# Patient Record
Sex: Male | Born: 2005
Health system: Southern US, Community
[De-identification: ages and names within clinical notes are randomized; demographics above are authoritative.]

## PROBLEM LIST (undated history)

## (undated) DIAGNOSIS — R569 Unspecified convulsions: Secondary | ICD-10-CM

## (undated) HISTORY — PX: CIRCUMCISION: SUR203

## (undated) HISTORY — DX: Unspecified convulsions: R56.9

---
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
## 2023-01-03 ENCOUNTER — Ambulatory Visit (INDEPENDENT_AMBULATORY_CARE_PROVIDER_SITE_OTHER): Payer: BC Managed Care – PPO | Admitting: Neurology

## 2023-01-03 DIAGNOSIS — G40309 Generalized idiopathic epilepsy and epileptic syndromes, not intractable, without status epilepticus: Secondary | ICD-10-CM | POA: Diagnosis not present

## 2023-01-03 NOTE — Progress Notes (Signed)
EEG complete - results pending 

## 2023-01-05 NOTE — Progress Notes (Deleted)
Patient: Glenn Blackburn. MRN: [id number] Sex: male DOB: 2005-08-09  Provider: Keturah Shavers, MD Location of Care: [facility]  Note type: {CN NOTE OZHYQ:[id number]}  Referral Source: Elias Else, MD History from: {CN REFERRED XB:[id number]} Chief Complaint: Follow up ADHD, Seizures, Autism, Vitamin D Deficiency  History of Present Illness:  Masaji Valerio. is a 17 y.o. male ***.  Review of Systems: Review of system as per HPI, otherwise negative.  Past Medical History:  Diagnosis Date   Seizures (HCC)    Hospitalizations: {yes no:314532}, Head Injury: {yes no:314532}, Nervous System Infections: {yes no:314532}, Immunizations up to date: {yes no:314532}  Birth History ***  Surgical History Past Surgical History:  Procedure Laterality Date   CIRCUMCISION  2005   TONSILLECTOMY AND ADENOIDECTOMY  August 2012    Family History family history includes Diabetes in his maternal grandfather; Heart disease in his paternal grandmother; Hypertension in his maternal grandfather and paternal grandmother; Seizures in his maternal aunt, maternal grandmother, mother, and other family members. Family History is negative for ***.  Social History Social History   Socioeconomic History   Marital status: Single    Spouse name: Not on file   Number of children: Not on file   Years of education: Not on file   Highest education level: Not on file  Occupational History   Not on file  Tobacco Use   Smoking status: Never    Passive exposure: Yes   Smokeless tobacco: Never  Substance and Sexual Activity   Alcohol use: No   Drug use: No   Sexual activity: Never  Other Topics Concern   Not on file  Social History Narrative   Jebadiah is in the 11th grade and is homeschooled for the 23/24 school year   Lives with both parents. He has an adult brother that does not live in the home.    Doing fine in school   Social Determinants of Health   Financial  Resource Strain: Not on file  Food Insecurity: Not on file  Transportation Needs: Not on file  Physical Activity: Not on file  Stress: Not on file  Social Connections: Not on file     No Known Allergies  Physical Exam There were no vitals taken for this visit. ***  Assessment and Plan ***  No orders of the defined types were placed in this encounter.  No orders of the defined types were placed in this encounter.

## 2023-01-08 ENCOUNTER — Telehealth (INDEPENDENT_AMBULATORY_CARE_PROVIDER_SITE_OTHER): Payer: Self-pay

## 2023-01-08 ENCOUNTER — Ambulatory Visit (INDEPENDENT_AMBULATORY_CARE_PROVIDER_SITE_OTHER): Payer: Self-pay | Admitting: Neurology

## 2023-01-08 NOTE — Procedures (Signed)
Patient:  Glenn Blackburn.   Sex: male  DOB:  Oct 28, 2005  Date of study: 01/03/2023                Clinical history: This is an 17 year old male with history of autism, ADHD, sleep difficulty and generalized seizure disorder with no clinical seizure activity since 2021.  His previous EEG was abnormal with bursts of generalized discharges.  This is a follow-up EEG for evaluation of epileptiform discharges.  Medication: Depakote              Procedure: The tracing was carried out on a 32 channel digital Cadwell recorder reformatted into 16 channel montages with 1 devoted to EKG.  The 10 /20 international system electrode placement was used. Recording was done during awake state. Recording time 30.5 minutes.   Description of findings: Background rhythm consists of amplitude of 35 microvolt and frequency of 8-9 hertz posterior dominant rhythm. There was normal anterior posterior gradient noted. Background was well organized, continuous and symmetric with no focal slowing. There was muscle artifact noted. Hyperventilation resulted in slowing of the background activity. Photic stimulation using stepwise increase in photic frequency resulted in bilateral symmetric driving response. Throughout the recording there were bursts of high amplitude generalized polymorphic discharges in the form of spike and wave activity noted during the first part of the recording. There were no transient rhythmic activities or electrographic seizures noted. One lead EKG rhythm strip revealed sinus rhythm at a rate of 60 bpm.  Impression: This EEG is abnormal due to bursts of generalized discharges as described. The findings are consistent with generalized seizure disorder, associated with lower seizure threshold and require careful clinical correlation.   Keturah Shavers, MD

## 2023-01-08 NOTE — Telephone Encounter (Signed)
LM for Mother to call office and set up MyChart access as Legal Guardian of patient (Diminished capacity patient/complex needs)  Note: Upon return call, please assist.  B. Roten CMA

## 2023-01-08 NOTE — Progress Notes (Deleted)
   This is a Pediatric Specialist E-Visit follow up consult provided via *** (select one) Telephone, MyChart, WebEx Davien Freddie Apley. and their parent/guardian ***   consented to an E-Visit consult today.  Location of patient: Glenn Blackburn is at Home(location) Location of provider: Keturah Shavers, MD is at Office (location) Patient was referred by No ref. provider found   The following participants were involved in this E-Visit: Hali Marry, CMA              Keturah Shavers, MD  Chief Complain/ Reason for E-Visit today: Follow up for Seizures, ADHD, Autism & Vitamin D Total time on call: *** Follow up: ***   Patient: Glenn Blackburn. MRN: [id number] Sex: male DOB: 2005-11-16  Provider: Keturah Shavers, MD Location of Care: [facility]  Note type: {CN NOTE TYPES:210120001} History from: {CN REFERRED WU:[id number]} Chief Complaint: Follow up Seizures, ADHD, Autism & Vitamin D   History of Present Illness:  Glenn Blackburn. is a 17 y.o. male ***.  Review of Systems: 12 system review as per HPI, otherwise negative.  Past Medical History:  Diagnosis Date   Seizures (HCC)    Hospitalizations: {yes no:314532}, Head Injury: {yes no:314532}, Nervous System Infections: {yes no:314532}, Immunizations up to date: {yes no:314532}  Birth History ***  Surgical History Past Surgical History:  Procedure Laterality Date   CIRCUMCISION  2005   TONSILLECTOMY AND ADENOIDECTOMY  August 2012    Family History family history includes Diabetes in his maternal grandfather; Heart disease in his paternal grandmother; Hypertension in his maternal grandfather and paternal grandmother; Seizures in his maternal aunt, maternal grandmother, mother, and other family members. Family History is negative for ***.  Social History Social History   Socioeconomic History   Marital status: Single    Spouse name: Not on file   Number of children: Not on file   Years of  education: Not on file   Highest education level: Not on file  Occupational History   Not on file  Tobacco Use   Smoking status: Never    Passive exposure: Yes   Smokeless tobacco: Never  Substance and Sexual Activity   Alcohol use: No   Drug use: No   Sexual activity: Never  Other Topics Concern   Not on file  Social History Narrative   Axle is in the 11th grade and is homeschooled for the 23/24 school year   Lives with both parents. He has an adult brother that does not live in the home.    Doing fine in school   Social Determinants of Health   Financial Resource Strain: Not on file  Food Insecurity: Not on file  Transportation Needs: Not on file  Physical Activity: Not on file  Stress: Not on file  Social Connections: Not on file     The medication list was reviewed and reconciled. All changes or newly prescribed medications were explained.  A complete medication list was provided to the patient/caregiver.  No Known Allergies  Physical Exam There were no vitals taken for this visit. ***  Assessment and Plan ***  No orders of the defined types were placed in this encounter.  No orders of the defined types were placed in this encounter.

## 2023-01-08 NOTE — Telephone Encounter (Signed)
Mom returned call.  MyChart (Diminished capacity) access provided.  B. Roten CMA

## 2023-01-09 ENCOUNTER — Other Ambulatory Visit (INDEPENDENT_AMBULATORY_CARE_PROVIDER_SITE_OTHER): Payer: Self-pay

## 2023-01-09 ENCOUNTER — Telehealth (INDEPENDENT_AMBULATORY_CARE_PROVIDER_SITE_OTHER): Payer: Self-pay | Admitting: Neurology

## 2023-01-09 MED ORDER — DIVALPROEX SODIUM ER 500 MG PO TB24: 1000.0000 mg | ORAL_TABLET | Freq: Every day | ORAL | 0 refills | Status: DC

## 2023-01-09 NOTE — Telephone Encounter (Signed)
Changed to In person visit: July 2024.   B. Roten CMA

## 2023-01-09 NOTE — Telephone Encounter (Signed)
Called mother and changed Video Visit (from today 06-18) to in person visit in July.   Note: 06-17*-2024 (Late/No Show/Reschedule).  Rx refill request sent to provider through/until next appointment.  B. Roten CMA

## 2023-02-20 ENCOUNTER — Encounter (INDEPENDENT_AMBULATORY_CARE_PROVIDER_SITE_OTHER): Payer: Self-pay | Admitting: Neurology

## 2023-02-20 ENCOUNTER — Ambulatory Visit (INDEPENDENT_AMBULATORY_CARE_PROVIDER_SITE_OTHER): Payer: BC Managed Care – PPO | Admitting: Neurology

## 2023-02-20 VITALS — BP 114/70 | HR 78 | Ht 72.95 in | Wt 150.2 lb

## 2023-02-20 DIAGNOSIS — E559 Vitamin D deficiency, unspecified: Secondary | ICD-10-CM

## 2023-02-20 DIAGNOSIS — F902 Attention-deficit hyperactivity disorder, combined type: Secondary | ICD-10-CM

## 2023-02-20 DIAGNOSIS — F840 Autistic disorder: Secondary | ICD-10-CM | POA: Diagnosis not present

## 2023-02-20 DIAGNOSIS — G40309 Generalized idiopathic epilepsy and epileptic syndromes, not intractable, without status epilepticus: Secondary | ICD-10-CM | POA: Diagnosis not present

## 2023-02-20 LAB — CBC WITH DIFFERENTIAL/PLATELET
Absolute Monocytes: 439 cells/uL (ref 200–900)
Basophils Absolute: 31 cells/uL (ref 0–200)
Basophils Relative: 0.3 %
Eosinophils Absolute: 143 cells/uL (ref 15–500)
Eosinophils Relative: 1.4 %
HCT: 42 % (ref 36.0–49.0)
Hemoglobin: 12.8 g/dL (ref 12.0–16.9)
Lymphs Abs: 3988 cells/uL (ref 1200–5200)
MCH: 24 pg — ABNORMAL LOW (ref 25.0–35.0)
MCHC: 30.5 g/dL — ABNORMAL LOW (ref 31.0–36.0)
MCV: 78.7 fL (ref 78.0–98.0)
MPV: 11.3 fL (ref 7.5–12.5)
Monocytes Relative: 4.3 %
Neutro Abs: 5600 cells/uL (ref 1800–8000)
Neutrophils Relative %: 54.9 %
Platelets: 312 10*3/uL (ref 140–400)
RBC: 5.34 10*6/uL (ref 4.10–5.70)
RDW: 12.3 % (ref 11.0–15.0)
Total Lymphocyte: 39.1 %
WBC: 10.2 10*3/uL (ref 4.5–13.0)

## 2023-02-20 MED ORDER — DIVALPROEX SODIUM ER 500 MG PO TB24: 1000.0000 mg | ORAL_TABLET | Freq: Every day | ORAL | 2 refills | Status: DC

## 2023-02-20 MED ORDER — NAYZILAM 5 MG/0.1ML NA SOLN: NASAL | 1 refills | Status: DC

## 2023-02-20 NOTE — Patient Instructions (Signed)
Continue the same dose of Depakote at 1000 mg every night We will schedule for blood work to be done today He needs to have adequate sleep and limited screen time I will send a prescription for Nayzilam as a rescue medication in case of prolonged seizure activity We will schedule for sleep deprived EEG at the same time in the next visit Return in 7 months for follow-up visit

## 2023-02-20 NOTE — Progress Notes (Signed)
Patient: Glenn Blackburn. MRN: [id number] Sex: male DOB: 2006/03/01  Provider: Keturah Shavers, MD Location of Care: [facility] Child Neurology  Note type: Routine return visit  Referral Source: Elias Else, MD History from: patient, [facility] chart, and father Chief Complaint: seizures  History of Present Illness: Glenn Blackburn. is a 17 y.o. male is here for follow-up management of seizure disorder. He has diagnosis of autism spectrum disorder, ADHD, anxiety, sleep difficulty and generalized seizure disorder, has been on Depakote with fairly good seizure control although he did have a breakthrough seizure a couple months ago when he was out of medication. His EEG is including the last 1 last month in June shows bursts of generalized discharges and occasionally come in clusters.  His last blood work in May 2021 showed Depakote level 39.5 and he has not done the blood work that was recommended on his last visit in July 2023. Currently he is taking Depakote long-acting form 1000 mg every night with no side effects. He usually sleeps well without any difficulty and with no awakening.  He has no significant behavioral or mood changes.  He does have history of vitamin D deficiency-last vitamin D level was 8. As per father he usually sleeps very late and he plays videogames a lot throughout the day and night.    Review of Systems: Review of system as per HPI, otherwise negative.  Past Medical History:  Diagnosis Date   Seizures [facility])     Surgical History Past Surgical History:  Procedure Laterality Date   CIRCUMCISION  2005   TONSILLECTOMY AND ADENOIDECTOMY  August 2012    Family History family history includes Diabetes in his maternal grandfather; Heart disease in his paternal grandmother; Hypertension in his maternal grandfather and paternal grandmother; Seizures in his maternal aunt, maternal grandmother, mother, and other family members.   Social History Social  History   Socioeconomic History   Marital status: Single    Spouse name: Not on file   Number of children: Not on file   Years of education: Not on file   Highest education level: Not on file  Occupational History   Not on file  Tobacco Use   Smoking status: Never    Passive exposure: Yes   Smokeless tobacco: Never  Substance and Sexual Activity   Alcohol use: No   Drug use: No   Sexual activity: Never  Other Topics Concern   Not on file  Social History Narrative   Glenn Blackburn is in the 11th grade and is homeschooled for the 23/24 school year   Lives with both parents. He has an adult brother that does not live in the home.    Doing fine in school   Social Determinants of Health   Financial Resource Strain: Not on file  Food Insecurity: Not on file  Transportation Needs: Not on file  Physical Activity: Not on file  Stress: Not on file  Social Connections: Not on file     No Known Allergies  Physical Exam BP 114/70   Pulse 78   Ht 6' 0.95" (1.853 m)   Wt 150 lb 4 oz (68.2 kg)   BMI 19.85 kg/m  Gen: Awake, alert, not in distress Skin: No rash, No neurocutaneous stigmata. HEENT: Normocephalic, no dysmorphic features, no conjunctival injection, nares patent, mucous membranes moist, oropharynx clear. Neck: Supple, no meningismus. No focal tenderness. Resp: Clear to auscultation bilaterally CV: Regular rate, normal S1/S2, no murmurs, no rubs Abd: BS present, abdomen soft, non-tender, non-distended.  No hepatosplenomegaly or mass Ext: Warm and well-perfused. No deformities, no muscle wasting, ROM full.  Neurological Examination: MS: Awake, alert, interactive. Normal eye contact, answered the questions appropriately, speech was fluent,  Normal comprehension.  Attention and concentration were normal. Cranial Nerves: Pupils were equal and reactive to light ( 5-51mm);  normal fundoscopic exam with sharp discs, visual field full with confrontation test; EOM normal, no nystagmus;  no ptsosis, no double vision, intact facial sensation, face symmetric with full strength of facial muscles, hearing intact to finger rub bilaterally, palate elevation is symmetric, tongue protrusion is symmetric with full movement to both sides.  Sternocleidomastoid and trapezius are with normal strength. Tone-Normal Strength-Normal strength in all muscle groups DTRs-  Biceps Triceps Brachioradialis Patellar Ankle  R 2+ 2+ 2+ 2+ 2+  L 2+ 2+ 2+ 2+ 2+   Plantar responses flexor bilaterally, no clonus noted Sensation: Intact to light touch, temperature, vibration, Romberg negative. Coordination: No dysmetria on FTN test. No difficulty with balance. Gait: Normal walk and run. Tandem gait was normal. Was able to perform toe walking and heel walking without difficulty.   Assessment and Plan 1. Generalized seizure disorder (HCC)   2. Attention deficit hyperactivity disorder (ADHD), combined type   3. Autism spectrum   4. Vitamin D deficiency     This is an 21 and half-year-old male with history of autism, ADHD, anxiety and seizure disorder, currently on moderate dose of Depakote with fairly good seizure control although with 1 breakthrough seizure 2 months ago when he was out of medication.  He has no focal findings on his neurological examination at this time. Recommend to continue the same dose of Depakote at 1 g daily We will schedule for blood work to check the trough level of medication as well as CBC and CMP and vitamin D level to be done today I will send a prescription for Nayzilam as a rescue medication in case of prolonged seizure activity We discussed with patient and his father in details regarding the importance of adequate sleep and limited screen time to prevent seizure activity and also not to skip any dose of medication which may cause more seizure We will schedule for a follow-up sleep deprived EEG at the same time the next visit I would like to see him in 6 months or 7 months  for follow-up visit and based on clinical response and EEG and blood work may adjust the dose of medication.  He and his father understood and agreed with the plan.  I spent 40 minutes with patient and his father, more than 50% time spent for counseling and coordination of care.   Meds ordered this encounter  Medications   divalproex (DEPAKOTE ER) 500 MG 24 hr tablet    Sig: Take 2 tablets (1,000 mg total) by mouth at bedtime.    Dispense:  180 tablet    Refill:  2   Midazolam (NAYZILAM) 5 MG/0.1ML SOLN    Sig: Apply 5 mg nasally for seizures lasting longer than 5 minutes    Dispense:  2 each    Refill:  1   Orders Placed This Encounter  Procedures   Valproic acid level   CBC with Differential/Platelet   VITAMIN D 25 Hydroxy (Vit-D Deficiency, Fractures)   Comprehensive metabolic panel   Child sleep deprived EEG    Standing Status:   Future    Standing Expiration Date:   02/20/2024

## 2023-02-22 ENCOUNTER — Telehealth (INDEPENDENT_AMBULATORY_CARE_PROVIDER_SITE_OTHER): Payer: Self-pay | Admitting: Neurology

## 2023-02-22 NOTE — Telephone Encounter (Signed)
  Name of who is calling: Milas Hock   Caller's Relationship to Patient: Mom  Best contact number: [phone]  Provider they see: Dr Merri Brunette  Reason for call: Mom is calling about Nayzilam medication. She stated that it costs $400 and she is wondering if a PA could be done or if he just could be prescribed something else. She is also asking for him to be prescribed sleep medication if possible.     PRESCRIPTION REFILL ONLY  Name of prescription:  Pharmacy:

## 2023-02-22 NOTE — Telephone Encounter (Signed)
Contacted patients insurance to initiate a PA for [employer]   Determination Pending.  Last Office Visit note faxed to insurance.   Fax: [phone]  Key: [id number]

## 2023-02-27 ENCOUNTER — Telehealth (INDEPENDENT_AMBULATORY_CARE_PROVIDER_SITE_OTHER): Payer: Self-pay

## 2023-02-27 NOTE — Telephone Encounter (Signed)
Fax from [facility] stated refill is too soon. Needs to be filled on or after 03/16/2023. Message left on Pharm line at [facility]

## 2023-03-09 DIAGNOSIS — U071 COVID-19: Secondary | ICD-10-CM | POA: Diagnosis not present

## 2023-09-05 ENCOUNTER — Encounter (INDEPENDENT_AMBULATORY_CARE_PROVIDER_SITE_OTHER): Payer: Self-pay | Admitting: Neurology

## 2023-09-05 ENCOUNTER — Ambulatory Visit (INDEPENDENT_AMBULATORY_CARE_PROVIDER_SITE_OTHER): Payer: BC Managed Care – PPO | Admitting: Neurology

## 2023-09-05 VITALS — BP 118/66 | HR 60 | Ht 73.23 in | Wt 144.2 lb

## 2023-09-05 DIAGNOSIS — F840 Autistic disorder: Secondary | ICD-10-CM

## 2023-09-05 DIAGNOSIS — F902 Attention-deficit hyperactivity disorder, combined type: Secondary | ICD-10-CM

## 2023-09-05 DIAGNOSIS — E559 Vitamin D deficiency, unspecified: Secondary | ICD-10-CM | POA: Diagnosis not present

## 2023-09-05 DIAGNOSIS — G40309 Generalized idiopathic epilepsy and epileptic syndromes, not intractable, without status epilepticus: Secondary | ICD-10-CM

## 2023-09-05 MED ORDER — DIVALPROEX SODIUM ER 500 MG PO TB24: 1000.0000 mg | ORAL_TABLET | Freq: Every day | ORAL | 2 refills | Status: DC

## 2023-09-05 MED ORDER — NAYZILAM 5 MG/0.1ML NA SOLN: NASAL | 1 refills | Status: DC

## 2023-09-05 NOTE — Procedures (Signed)
Patient:  Glenn Blackburn.   Sex: male  DOB:  2005-08-26  Date of study:    09/05/2023              Clinical history: This is a 18 year old male with diagnosis of autism spectrum disorder and generalized seizure disorder, on AED with good seizure control.  This is a follow-up EEG for evaluation of epileptiform discharges.  Medication:   Depakote            Procedure: The tracing was carried out on a 32 channel digital Cadwell recorder reformatted into 16 channel montages with 1 devoted to EKG.  The 10 /20 international system electrode placement was used. Recording was done during awake, drowsiness and sleep states. Recording time 41 minutes.   Description of findings: Background rhythm consists of amplitude of     35 microvolt and frequency of 9-10 hertz posterior dominant rhythm. There was normal anterior posterior gradient noted. Background was well organized, continuous and symmetric with no focal slowing. There was muscle artifact noted. During drowsiness and sleep there was gradual decrease in background frequency noted. During the early stages of sleep there were symmetrical sleep spindles and vertex sharp waves noted.  Hyperventilation resulted in slowing of the background activity. Photic stimulation using stepwise increase in photic frequency resulted in bilateral symmetric driving response. Throughout the recording there were occasional brief generalized spikes and sharps noted with duration of less than 1 second and occasionally up to 2 seconds, more frequent during hyperventilation.  There were no transient rhythmic activities or electrographic seizures noted. One lead EKG rhythm strip revealed sinus rhythm at a rate of 75 bpm.  Impression: This EEG is abnormal during awake and brief period of sleep due to occasional brief generalized discharges as described. The findings are consistent with generalized seizure disorder, associated with lower seizure threshold and require careful  clinical correlation.    Keturah Shavers, MD

## 2023-09-05 NOTE — Progress Notes (Signed)
Sleep deprived EEG complete - results pending.

## 2023-09-05 NOTE — Progress Notes (Signed)
Patient: Glenn Blackburn. MRN: [id number] Sex: male DOB: 2005-12-06  Provider: Keturah Shavers, MD Location of Care: [facility] Child Neurology  Note type: Routine return visit  Referral Source: Elias Else, MD History from: patient, [facility] chart, and dad Chief Complaint: Seizures   History of Present Illness: Glenn Everitt. is a 18 y.o. male is here for follow-up management of seizure disorder. He has a diagnosis of autism spectrum disorder, ADHD, anxiety, sleep difficulty and generalized seizure disorder, on Depakote with good seizure control although he had a breakthrough seizure in the first half of 2024 due to missing a few doses of medication. He was last seen in July 2024 and he was recommended to continue the same dose of Depakote at 1000 mg every night and also he was recommended to have some blood work and a follow-up EEG and return in a few months to see how he does. Previously he was taking some other medications including stimulant medication, clonidine and Lexapro but currently is not taking any other medication except for Depakote.  He also has history of vitamin D deficiency but he was not taking any vitamin D supplements. His previous EEG was in June 2024 with bursts of generalized discharges. His blood work in July 2024 showed vitamin D of 10 with normal CBC and CMP and Depakote level of 120. Overall and since his last visit he has been doing very well without having any other issues and he and his father are happy with his progress. His EEG today prior to this visit showed occasional brief generalized discharges but it is overall improving compared to the previous EEG.    Review of Systems: Review of system as per HPI, otherwise negative.  Past Medical History:  Diagnosis Date   Seizures (HCC)    Hospitalizations: No., Head Injury: No., Nervous System Infections: No., Immunizations up to date: Yes.     Surgical History Past Surgical History:   Procedure Laterality Date   CIRCUMCISION  2005   TONSILLECTOMY AND ADENOIDECTOMY  August 2012    Family History family history includes Diabetes in his maternal grandfather; Heart disease in his paternal grandmother; Hypertension in his maternal grandfather and paternal grandmother; Seizures in his maternal aunt, maternal grandmother, mother, and other family members.   Social History Social History   Socioeconomic History   Marital status: Single    Spouse name: Not on file   Number of children: Not on file   Years of education: Not on file   Highest education level: Not on file  Occupational History   Not on file  Tobacco Use   Smoking status: Never    Passive exposure: Yes   Smokeless tobacco: Never  Substance and Sexual Activity   Alcohol use: No   Drug use: No   Sexual activity: Never  Other Topics Concern   Not on file  Social History Narrative   Graduated High School    Lives with both parents. He has an adult brother that does not live in the home.    Doing fine in school   Social Drivers of Health   Financial Resource Strain: Not on file  Food Insecurity: Not on file  Transportation Needs: Not on file  Physical Activity: Not on file  Stress: Not on file  Social Connections: Not on file     No Known Allergies  Physical Exam BP 118/66   Pulse 60   Ht 6' 1.23" (1.86 m)   Wt 144 lb 2.9 oz (65.4  kg)   BMI 18.90 kg/m  Gen: Awake, alert, not in distress Skin: No rash, No neurocutaneous stigmata. HEENT: Normocephalic, no dysmorphic features, no conjunctival injection, nares patent, mucous membranes moist, oropharynx clear. Neck: Supple, no meningismus. No focal tenderness. Resp: Clear to auscultation bilaterally CV: Regular rate, normal S1/S2, no murmurs, no rubs Abd: BS present, abdomen soft, non-tender, non-distended. No hepatosplenomegaly or mass Ext: Warm and well-perfused. No deformities, no muscle wasting, ROM full.  Neurological  Examination: MS: Awake, alert, interactive. Normal eye contact, answered the questions appropriately, speech was fluent,  Normal comprehension.  Attention and concentration were normal. Cranial Nerves: Pupils were equal and reactive to light ( 5-38mm);  normal fundoscopic exam with sharp discs, visual field full with confrontation test; EOM normal, no nystagmus; no ptsosis, no double vision, intact facial sensation, face symmetric with full strength of facial muscles, hearing intact to finger rub bilaterally, palate elevation is symmetric, tongue protrusion is symmetric with full movement to both sides.  Sternocleidomastoid and trapezius are with normal strength. Tone-Normal Strength-Normal strength in all muscle groups DTRs-  Biceps Triceps Brachioradialis Patellar Ankle  R 2+ 2+ 2+ 2+ 2+  L 2+ 2+ 2+ 2+ 2+   Plantar responses flexor bilaterally, no clonus noted Sensation: Intact to light touch, temperature, vibration, Romberg negative. Coordination: No dysmetria on FTN test. No difficulty with balance. Gait: Normal walk and run. Tandem gait was normal. Was able to perform toe walking and heel walking without difficulty.   Assessment and Plan 1. Generalized seizure disorder (HCC)   2. Attention deficit hyperactivity disorder (ADHD), combined type   3. Autism spectrum   4. Vitamin D deficiency    This is a 19 year old male with history of autism spectrum disorder, ADHD and some anxiety and mood issues, vitamin D deficiency as well as generalized seizure disorder on Depakote with good seizure control and no clinical seizure activity over the past few years except for 1 breakthrough seizure last year after missing a few doses of medication. His EEG today is improving although still showing brief generalized discharges and his blood work showed appropriate level of Depakote but low vitamin D of 10. Recommend to continue the same dose of Depakote at 1000 mg every night Recommend to start taking  vitamin D3 5000 units daily for 2 months He needs to continue with adequate sleep and limited screen time I sent a prescription for Nayzilam as a rescue medication in case of prolonged seizure activity Father will call my office if he develops any seizure He will continue follow-up with primary care physician I would like to see him in 7 months for follow-up visit or sooner if he develops seizure activity and after his next visit we will repeat blood work and EEG.  He and his father understood and agreed with the plan.  I spent 40 minutes with patient and his father, more than 50% time spent for counseling and coordination of care.  Meds ordered this encounter  Medications   Midazolam (NAYZILAM) 5 MG/0.1ML SOLN    Sig: Apply 5 mg nasally for seizures lasting longer than 5 minutes    Dispense:  2 each    Refill:  1   divalproex (DEPAKOTE ER) 500 MG 24 hr tablet    Sig: Take 2 tablets (1,000 mg total) by mouth at bedtime.    Dispense:  180 tablet    Refill:  2   No orders of the defined types were placed in this encounter.

## 2023-09-05 NOTE — Patient Instructions (Signed)
His EEG is improving but still showing occasional generalized discharges He also has low vitamin D level but otherwise normal blood work Continue the same dose of Depakote at 2 tablets of 500 mg every night Continue with adequate sleep and limited screen time Start taking vitamin D3 5000 units daily for 2 months Return in 7 months for follow-up visit

## 2023-10-03 ENCOUNTER — Telehealth (INDEPENDENT_AMBULATORY_CARE_PROVIDER_SITE_OTHER): Payer: Self-pay | Admitting: Neurology

## 2023-10-03 NOTE — Telephone Encounter (Signed)
 Mom called to see if Nayzilam needed a PA I told her she would have to call the insurance to see and if it does need one then we would be more than happy to get it done for her.  Mom understood message and will call insurance

## 2023-10-03 NOTE — Telephone Encounter (Signed)
  Name of who is calling:   Caller's Relationship to Patient:  Best contact number: [phone]  Provider they see: nab   Reason for call: mom called due to the [facility] was wondering why the nasal spray is $400, is there a PA that is needed or is there another medication that would be covered by their insurance ? Mom would like a call regarding this. She also stated at this time she is at work if she doesn't answer could yall leave her a message.    PRESCRIPTION REFILL ONLY  Name of prescription:  Pharmacy:

## 2024-04-08 ENCOUNTER — Ambulatory Visit (INDEPENDENT_AMBULATORY_CARE_PROVIDER_SITE_OTHER): Payer: BC Managed Care – PPO | Admitting: Neurology

## 2024-04-08 ENCOUNTER — Encounter (INDEPENDENT_AMBULATORY_CARE_PROVIDER_SITE_OTHER): Payer: Self-pay | Admitting: Neurology

## 2024-04-08 VITALS — BP 122/72 | HR 68 | Ht 73.03 in | Wt 150.6 lb

## 2024-04-08 DIAGNOSIS — F902 Attention-deficit hyperactivity disorder, combined type: Secondary | ICD-10-CM

## 2024-04-08 DIAGNOSIS — G40309 Generalized idiopathic epilepsy and epileptic syndromes, not intractable, without status epilepticus: Secondary | ICD-10-CM | POA: Diagnosis not present

## 2024-04-08 DIAGNOSIS — F840 Autistic disorder: Secondary | ICD-10-CM | POA: Diagnosis not present

## 2024-04-08 DIAGNOSIS — E559 Vitamin D deficiency, unspecified: Secondary | ICD-10-CM

## 2024-04-08 MED ORDER — NAYZILAM 5 MG/0.1ML NA SOLN: NASAL | 1 refills | Status: AC

## 2024-04-08 MED ORDER — DIVALPROEX SODIUM ER 500 MG PO TB24: 1000.0000 mg | ORAL_TABLET | Freq: Every day | ORAL | 2 refills | Status: AC

## 2024-04-08 NOTE — Patient Instructions (Signed)
 Continue the same dose of Depakote  at 1000 mg every night Start taking vitamin D3 5000 units daily for 2 months Follow-up with your primary care physician to check the level of vitamin D  and if there is any blood work, please check Depakote  level as well Continue with adequate sleep and limited screen time He needs to sleep at a specific time every night with no electronic at bedtime We will schedule for EEG at the same time in the next visit Return in 6 months for follow-up visit

## 2024-04-08 NOTE — Progress Notes (Addendum)
 Patient: Glenn Mcguinness Jr. MRN: [id number] Sex: male DOB: [birth year]  Provider: Norwood Abu, MD Location of Care: [facility]  Note type: Routine return visit  Referral Source: Gib Charleston, MD History from: patient, [facility] chart, and Mom and Dad Chief Complaint: Seizures   History of Present Illness: Glenn Matherne. is a 18 y.o. male is here for follow-up management of seizure disorder. He has a diagnosis of autism spectrum disorder, ADHD, anxiety, sleep difficulty and generalized seizure disorder, on Depakote  with good seizure control with no clinical seizure activity for more than a year with the last breakthrough seizure was the first half of 2024 after missing a dose of medication. Currently is taking Depakote  1000 mg every night, tolerating well with no side effects. He has not been taking any other medication and his last blood work was in July 2024 with Depakote  level of 120.8. Since his last visit in February 2025 he has been doing very well without having any seizure but he has been having significant difficulty sleeping at night and usually stay awake all through the night and then sleep early in the morning and most of the day. He was also having some low vitamin D  and previously it was 10 and he was supposed to have some vitamin D  supplement but he has not started yet although it was recommended on his last visit. His last EEG was in February 2025 which showed occasional brief generalized discharges which is improving compared to the previous EEG.  Review of Systems: Review of system as per HPI, otherwise negative.  Past Medical History:  Diagnosis Date   Seizures (HCC)    Hospitalizations: No., Head Injury: No., Nervous System Infections: No., Immunizations up to date: Yes.     Surgical History Past Surgical History:  Procedure Laterality Date   CIRCUMCISION  2005   TONSILLECTOMY AND ADENOIDECTOMY  August 2012    Family History family  history includes Diabetes in his maternal grandfather; Heart disease in his paternal grandmother; Hypertension in his maternal grandfather and paternal grandmother; Seizures in his maternal aunt, maternal grandmother, mother, and other family members.   Social History Social History   Socioeconomic History   Marital status: Single    Spouse name: Not on file   Number of children: Not on file   Years of education: Not on file   Highest education level: Not on file  Occupational History   Not on file  Tobacco Use   Smoking status: Never    Passive exposure: Yes   Smokeless tobacco: Never  Substance and Sexual Activity   Alcohol use: No   Drug use: No   Sexual activity: Never  Other Topics Concern   Not on file  Social History Narrative   Graduated High School    Lives with both parents. He has an adult brother that does not live in the home.    Doing fine in school   Social Drivers of Health   Financial Resource Strain: Not on file  Food Insecurity: Not on file  Transportation Needs: Low Risk (07/14/2022)   [facility]   Transportation    Has a lack of transportation kept you from medical appointments, meetings, work, or from getting things needed for daily living?: Not on file  Physical Activity: Not on file  Stress: Not on file  Social Connections: Not on file     No Known Allergies  Physical Exam BP 122/72   Pulse 68  Ht 6' 1.03 (1.855 m)   Wt 150 lb 9.2 oz (68.3 kg)   BMI 19.85 kg/m  Gen: Awake, alert, not in distress Skin: No rash, No neurocutaneous stigmata. HEENT: Normocephalic, no dysmorphic features, no conjunctival injection, nares patent, mucous membranes moist, oropharynx clear. Neck: Supple, no meningismus. No focal tenderness. Resp: Clear to auscultation bilaterally CV: Regular rate, normal S1/S2, no murmurs, no rubs Abd: BS present, abdomen soft, non-tender, non-distended. No hepatosplenomegaly or mass Ext:  Warm and well-perfused. No deformities, no muscle wasting, ROM full.  Neurological Examination: MS: Awake, alert, interactive. Normal eye contact, answered the questions appropriately, speech was fluent,  Normal comprehension.  Attention and concentration were normal. Cranial Nerves: Pupils were equal and reactive to light ( 5-56mm);  normal fundoscopic exam with sharp discs, visual field full with confrontation test; EOM normal, no nystagmus; no ptsosis, no double vision, intact facial sensation, face symmetric with full strength of facial muscles, hearing intact to finger rub bilaterally, palate elevation is symmetric, tongue protrusion is symmetric with full movement to both sides.  Sternocleidomastoid and trapezius are with normal strength. Tone-Normal Strength-Normal strength in all muscle groups DTRs-  Biceps Triceps Brachioradialis Patellar Ankle  R 2+ 2+ 2+ 2+ 2+  L 2+ 2+ 2+ 2+ 2+   Plantar responses flexor bilaterally, no clonus noted Sensation: Intact to light touch, temperature, vibration, Romberg negative. Coordination: No dysmetria on FTN test. No difficulty with balance. Gait: Normal walk and run. Tandem gait was normal. Was able to perform toe walking and heel walking without difficulty.   Assessment and Plan 1. Generalized seizure disorder (HCC)   2. Attention deficit hyperactivity disorder (ADHD), combined type   3. Autism spectrum   4. Vitamin D  deficiency    This is a 18 year old male with diagnosis of generalized seizure disorder, autism spectrum disorder, ADHD and vitamin D  deficiency, currently on moderate dose of Depakote  with good seizure control and no clinical seizure activity for more than a year.  He has no new findings on his neurological examination.  He does have significant difficulty sleeping through the night and usually sleeps throughout the day. Recommend to continue the same dose of Depakote  at 1000 mg every night We will schedule for EEG to be done at  the same time of the next appointment I will send a prescription for Nayzilam  as a rescue medication in case of prolonged seizure activity He will continue with adequate sleep and limited screen time I discussed the importance of nightly sleep and I would recommend to get a referral to see a psychologist for evaluation and discussing sleep hygiene I also recommend to start taking vitamin D  3 5000 units daily for a couple of months and then they need to check the level by his PCP I would like to see him in 6 months for follow-up visit and based on his next EEG will decide regarding adjusting the medication.  He and both parents understood and agreed with the plan. I spent 40 minutes with patient and both parents, more than 50% time spent for counseling and coordination of care and answering the questions.  Meds ordered this encounter  Medications   Midazolam  (NAYZILAM ) 5 MG/0.1ML SOLN    Sig: Apply 5 mg nasally for seizures lasting longer than 5 minutes    Dispense:  2 each    Refill:  1   divalproex  (DEPAKOTE  ER) 500 MG 24 hr tablet    Sig: Take 2 tablets (1,000 mg total) by mouth at bedtime.  Dispense:  180 tablet    Refill:  2   Orders Placed This Encounter  Procedures   Adult sleep deprived EEG    Standing Status:   Future    Expiration Date:   04/08/2025    Scheduling Instructions:     Please schedule the EEG at the same time the next appointment in 6 months    Where should this test be performed?:   [facility]

## 2024-10-08 ENCOUNTER — Ambulatory Visit (INDEPENDENT_AMBULATORY_CARE_PROVIDER_SITE_OTHER): Payer: Self-pay | Admitting: Neurology

## 2024-10-08 ENCOUNTER — Other Ambulatory Visit (INDEPENDENT_AMBULATORY_CARE_PROVIDER_SITE_OTHER)
